# Patient Record
Sex: Male | Born: 1940 | Race: White | Hispanic: No | Marital: Married | Smoking: Never smoker
Health system: Southern US, Community
[De-identification: ages and names within clinical notes are randomized; demographics above are authoritative.]

## PROBLEM LIST (undated history)

## (undated) DIAGNOSIS — N189 Chronic kidney disease, unspecified: Secondary | ICD-10-CM

## (undated) DIAGNOSIS — I10 Essential (primary) hypertension: Secondary | ICD-10-CM

## (undated) HISTORY — PX: TONSILLECTOMY: SUR1361

---

## 2001-08-16 HISTORY — PX: EYE SURGERY: SHX253

## 2014-01-04 ENCOUNTER — Other Ambulatory Visit: Payer: Self-pay | Admitting: Family Medicine

## 2014-01-04 DIAGNOSIS — M545 Low back pain, unspecified: Secondary | ICD-10-CM

## 2014-01-09 ENCOUNTER — Ambulatory Visit
Admission: RE | Admit: 2014-01-09 | Discharge: 2014-01-09 | Disposition: A | Discharge status: Home / Self Care | Payer: Medicare PPO | Source: Ambulatory Visit | Attending: Family Medicine | Admitting: Family Medicine

## 2014-01-09 DIAGNOSIS — M545 Low back pain, unspecified: Secondary | ICD-10-CM

## 2015-07-08 DIAGNOSIS — E782 Mixed hyperlipidemia: Secondary | ICD-10-CM | POA: Diagnosis not present

## 2015-07-08 DIAGNOSIS — J309 Allergic rhinitis, unspecified: Secondary | ICD-10-CM | POA: Diagnosis not present

## 2015-07-08 DIAGNOSIS — I10 Essential (primary) hypertension: Secondary | ICD-10-CM | POA: Diagnosis not present

## 2015-07-08 DIAGNOSIS — N529 Male erectile dysfunction, unspecified: Secondary | ICD-10-CM | POA: Diagnosis not present

## 2015-07-08 DIAGNOSIS — Z23 Encounter for immunization: Secondary | ICD-10-CM | POA: Diagnosis not present

## 2015-07-08 DIAGNOSIS — N183 Chronic kidney disease, stage 3 (moderate): Secondary | ICD-10-CM | POA: Diagnosis not present

## 2016-01-16 ENCOUNTER — Encounter (INDEPENDENT_AMBULATORY_CARE_PROVIDER_SITE_OTHER): Payer: Medicare Other | Admitting: Ophthalmology

## 2016-01-16 DIAGNOSIS — H35033 Hypertensive retinopathy, bilateral: Secondary | ICD-10-CM | POA: Diagnosis not present

## 2016-01-16 DIAGNOSIS — H26492 Other secondary cataract, left eye: Secondary | ICD-10-CM

## 2016-01-16 DIAGNOSIS — I10 Essential (primary) hypertension: Secondary | ICD-10-CM

## 2016-01-16 DIAGNOSIS — H43813 Vitreous degeneration, bilateral: Secondary | ICD-10-CM

## 2016-01-26 ENCOUNTER — Ambulatory Visit (INDEPENDENT_AMBULATORY_CARE_PROVIDER_SITE_OTHER): Payer: Medicare Other | Admitting: Ophthalmology

## 2016-01-26 DIAGNOSIS — H2702 Aphakia, left eye: Secondary | ICD-10-CM

## 2018-11-01 ENCOUNTER — Ambulatory Visit
Admission: RE | Admit: 2018-11-01 | Discharge: 2018-11-01 | Disposition: A | Discharge status: Home / Self Care | Payer: Medicare Other | Source: Ambulatory Visit | Attending: Family Medicine | Admitting: Family Medicine

## 2018-11-01 ENCOUNTER — Other Ambulatory Visit: Payer: Self-pay

## 2018-11-01 ENCOUNTER — Other Ambulatory Visit: Payer: Self-pay | Admitting: Family Medicine

## 2018-11-01 DIAGNOSIS — R05 Cough: Secondary | ICD-10-CM

## 2018-11-01 DIAGNOSIS — R053 Chronic cough: Secondary | ICD-10-CM

## 2020-05-27 DIAGNOSIS — L570 Actinic keratosis: Secondary | ICD-10-CM | POA: Diagnosis not present

## 2020-05-27 DIAGNOSIS — J309 Allergic rhinitis, unspecified: Secondary | ICD-10-CM | POA: Diagnosis not present

## 2020-05-27 DIAGNOSIS — Z0000 Encounter for general adult medical examination without abnormal findings: Secondary | ICD-10-CM | POA: Diagnosis not present

## 2020-05-27 DIAGNOSIS — N529 Male erectile dysfunction, unspecified: Secondary | ICD-10-CM | POA: Diagnosis not present

## 2020-05-27 DIAGNOSIS — N1831 Chronic kidney disease, stage 3a: Secondary | ICD-10-CM | POA: Diagnosis not present

## 2020-05-27 DIAGNOSIS — M7041 Prepatellar bursitis, right knee: Secondary | ICD-10-CM | POA: Diagnosis not present

## 2020-05-27 DIAGNOSIS — Z1389 Encounter for screening for other disorder: Secondary | ICD-10-CM | POA: Diagnosis not present

## 2020-05-27 DIAGNOSIS — E782 Mixed hyperlipidemia: Secondary | ICD-10-CM | POA: Diagnosis not present

## 2020-05-27 DIAGNOSIS — I10 Essential (primary) hypertension: Secondary | ICD-10-CM | POA: Diagnosis not present

## 2020-06-25 IMAGING — DX CHEST - 2 VIEW
2 series · 2 of 2 positions shown · non-contrast
Comparison: None.

CLINICAL DATA: Cough

EXAM:
CHEST - 2 VIEW

[dg chest 2 view (1 of 2)]
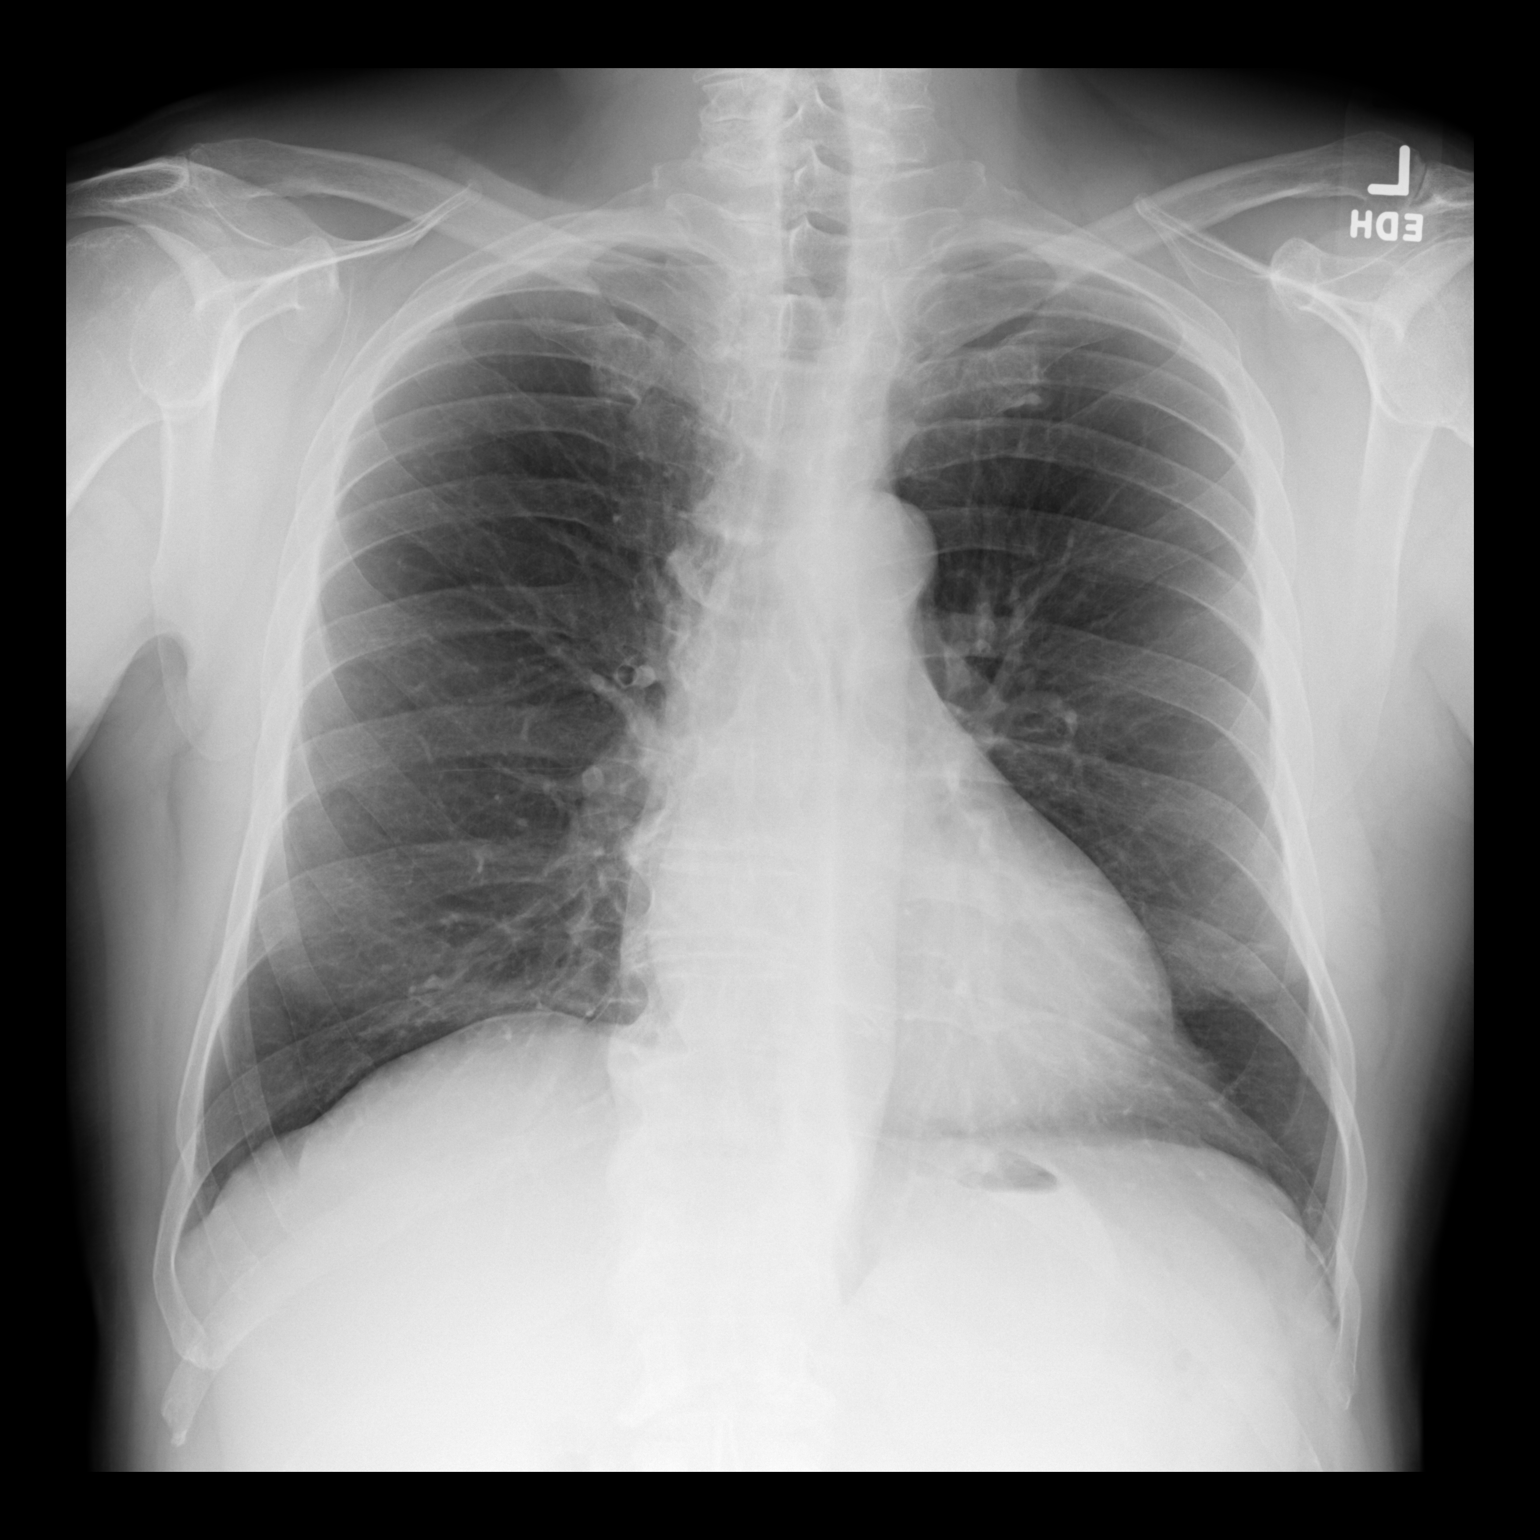

[dg chest 2 view (2 of 2)]
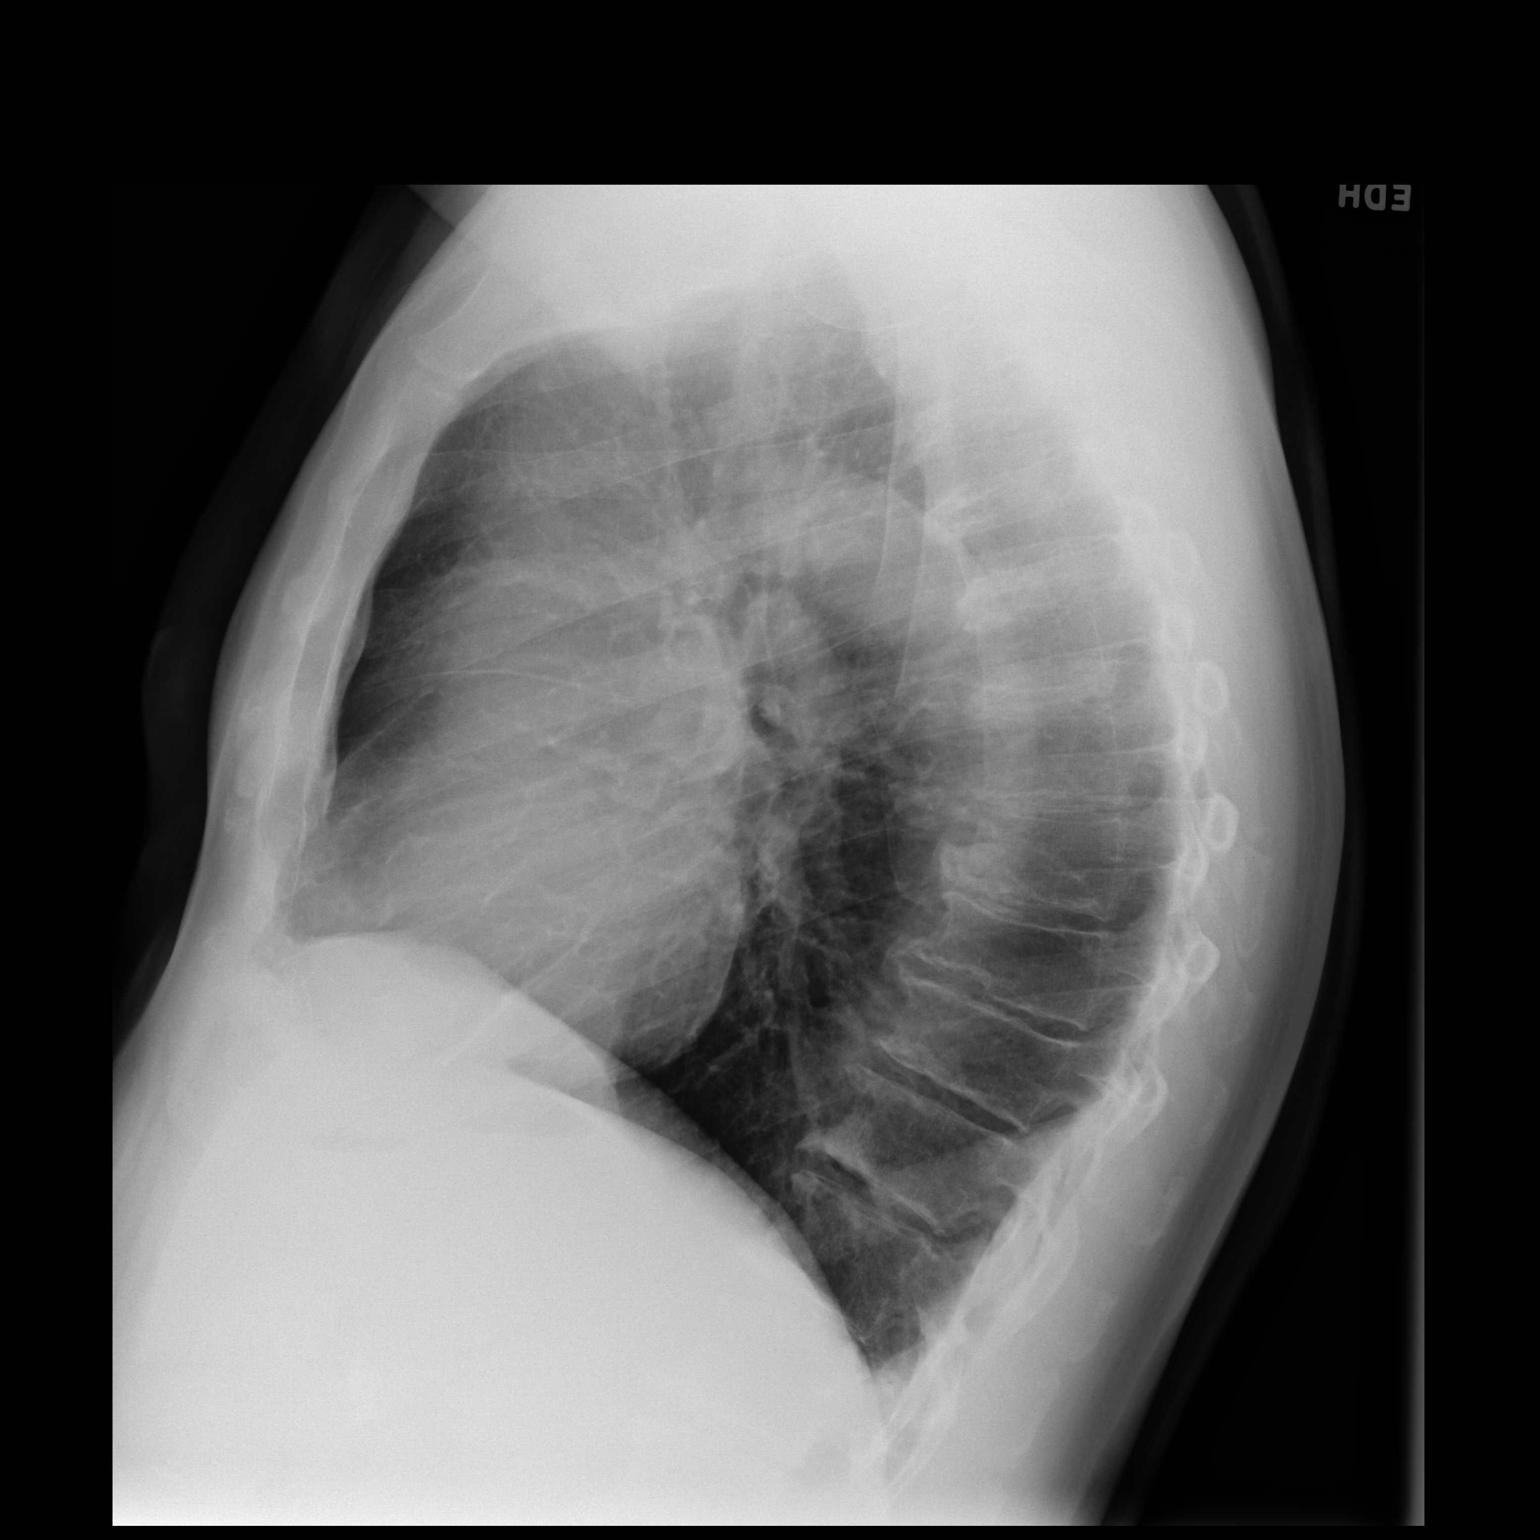

[2 of 2 positions shown; findings below may reference images not displayed]

FINDINGS: Heart and mediastinal contours are within normal limits. No focal
opacities or effusions. No acute bony abnormality. Degenerative
changes in the thoracic spine.
IMPRESSION: No active cardiopulmonary disease.

## 2020-09-09 DIAGNOSIS — D485 Neoplasm of uncertain behavior of skin: Secondary | ICD-10-CM | POA: Diagnosis not present

## 2020-09-09 DIAGNOSIS — L820 Inflamed seborrheic keratosis: Secondary | ICD-10-CM | POA: Diagnosis not present

## 2020-09-09 DIAGNOSIS — L603 Nail dystrophy: Secondary | ICD-10-CM | POA: Diagnosis not present

## 2020-09-09 DIAGNOSIS — L821 Other seborrheic keratosis: Secondary | ICD-10-CM | POA: Diagnosis not present

## 2020-09-09 DIAGNOSIS — L570 Actinic keratosis: Secondary | ICD-10-CM | POA: Diagnosis not present

## 2020-09-09 DIAGNOSIS — C4441 Basal cell carcinoma of skin of scalp and neck: Secondary | ICD-10-CM | POA: Diagnosis not present

## 2020-09-09 DIAGNOSIS — D044 Carcinoma in situ of skin of scalp and neck: Secondary | ICD-10-CM | POA: Diagnosis not present

## 2020-12-01 DIAGNOSIS — N1831 Chronic kidney disease, stage 3a: Secondary | ICD-10-CM | POA: Diagnosis not present

## 2020-12-01 DIAGNOSIS — D692 Other nonthrombocytopenic purpura: Secondary | ICD-10-CM | POA: Diagnosis not present

## 2020-12-01 DIAGNOSIS — J309 Allergic rhinitis, unspecified: Secondary | ICD-10-CM | POA: Diagnosis not present

## 2020-12-01 DIAGNOSIS — I10 Essential (primary) hypertension: Secondary | ICD-10-CM | POA: Diagnosis not present

## 2020-12-01 DIAGNOSIS — M48061 Spinal stenosis, lumbar region without neurogenic claudication: Secondary | ICD-10-CM | POA: Diagnosis not present

## 2020-12-01 DIAGNOSIS — N529 Male erectile dysfunction, unspecified: Secondary | ICD-10-CM | POA: Diagnosis not present

## 2020-12-01 DIAGNOSIS — E782 Mixed hyperlipidemia: Secondary | ICD-10-CM | POA: Diagnosis not present

## 2021-03-10 DIAGNOSIS — D485 Neoplasm of uncertain behavior of skin: Secondary | ICD-10-CM | POA: Diagnosis not present

## 2021-03-10 DIAGNOSIS — L570 Actinic keratosis: Secondary | ICD-10-CM | POA: Diagnosis not present

## 2021-03-10 DIAGNOSIS — L821 Other seborrheic keratosis: Secondary | ICD-10-CM | POA: Diagnosis not present

## 2021-03-10 DIAGNOSIS — C44612 Basal cell carcinoma of skin of right upper limb, including shoulder: Secondary | ICD-10-CM | POA: Diagnosis not present

## 2021-03-10 DIAGNOSIS — Z85828 Personal history of other malignant neoplasm of skin: Secondary | ICD-10-CM | POA: Diagnosis not present

## 2021-06-02 DIAGNOSIS — N1831 Chronic kidney disease, stage 3a: Secondary | ICD-10-CM | POA: Diagnosis not present

## 2021-06-02 DIAGNOSIS — N529 Male erectile dysfunction, unspecified: Secondary | ICD-10-CM | POA: Diagnosis not present

## 2021-06-02 DIAGNOSIS — Z0000 Encounter for general adult medical examination without abnormal findings: Secondary | ICD-10-CM | POA: Diagnosis not present

## 2021-06-02 DIAGNOSIS — K4090 Unilateral inguinal hernia, without obstruction or gangrene, not specified as recurrent: Secondary | ICD-10-CM | POA: Diagnosis not present

## 2021-06-02 DIAGNOSIS — I10 Essential (primary) hypertension: Secondary | ICD-10-CM | POA: Diagnosis not present

## 2021-06-02 DIAGNOSIS — M48061 Spinal stenosis, lumbar region without neurogenic claudication: Secondary | ICD-10-CM | POA: Diagnosis not present

## 2021-06-02 DIAGNOSIS — Z1389 Encounter for screening for other disorder: Secondary | ICD-10-CM | POA: Diagnosis not present

## 2021-06-02 DIAGNOSIS — E782 Mixed hyperlipidemia: Secondary | ICD-10-CM | POA: Diagnosis not present

## 2021-06-02 DIAGNOSIS — J309 Allergic rhinitis, unspecified: Secondary | ICD-10-CM | POA: Diagnosis not present

## 2021-06-17 ENCOUNTER — Ambulatory Visit: Payer: Self-pay | Admitting: Surgery

## 2021-06-17 DIAGNOSIS — H918X9 Other specified hearing loss, unspecified ear: Secondary | ICD-10-CM | POA: Diagnosis not present

## 2021-06-17 DIAGNOSIS — M6208 Separation of muscle (nontraumatic), other site: Secondary | ICD-10-CM | POA: Diagnosis not present

## 2021-06-17 DIAGNOSIS — K4090 Unilateral inguinal hernia, without obstruction or gangrene, not specified as recurrent: Secondary | ICD-10-CM | POA: Diagnosis not present

## 2021-08-14 NOTE — Patient Instructions (Addendum)
DUE TO COVID-19 ONLY ONE VISITOR IS ALLOWED TO COME WITH YOU AND STAY IN THE WAITING ROOM ONLY DURING PRE OP AND PROCEDURE DAY OF SURGERY IF YOU ARE GOING HOME AFTER SURGERY. IF YOU ARE SPENDING THE NIGHT 2 PEOPLE MAY VISIT WITH YOU IN YOUR PRIVATE ROOM AFTER SURGERY UNTIL VISITING  HOURS ARE OVER AT 800 PM AND 1  VISITOR  MAY  SPEND THE NIGHT.                 Terry Parker.    Your procedure is scheduled on: 08/27/21   Report to [facility] Main  Entrance   Report to admitting at  8:15 AM     Call this number if you have problems the morning of surgery [phone]   No food after midnight.    You may have clear liquid until 7:30 AM.    At 7:00 AM drink pre surgery drink.   Nothing by mouth after 7:30 AM.    CLEAR LIQUID DIET   Foods Allowed                                                                     Foods Excluded  Coffee and tea, regular and decaf                             liquids that you cannot  Plain Jell-O any favor except red or purple                                           see through such as: Fruit ices (not with fruit pulp)                                     milk, soups, orange juice  Iced Popsicles                                    All solid food Carbonated beverages, regular and diet                                    Cranberry, grape and apple juices Sports drinks like Gatorade Lightly seasoned clear broth or consume(fat free) Sugar     BRUSH YOUR TEETH MORNING OF SURGERY AND RINSE YOUR MOUTH OUT, NO CHEWING GUM CANDY OR MINTS.     Take these medicines the morning of surgery with A SIP OF WATER: none  Stop taking aspirin___________on __________as instructed by _____________.    Contact your Surgeon/Cardiologist for instructions on Anticoagulant Therapy prior to surgery.                                  You may not have any metal on your body including  piercings  Do not wear jewelry, lotions, powders or   deodorant             Men may shave face and neck.   Do not bring valuables to the hospital. [place].  Contacts, dentures or bridgework may not be worn into surgery.     Patients discharged the day of surgery will not be allowed to drive home.  IF YOU ARE HAVING SURGERY AND GOING HOME THE SAME DAY, YOU MUST HAVE AN ADULT TO DRIVE YOU HOME AND BE WITH YOU FOR 24 HOURS. YOU MAY GO HOME BY TAXI OR UBER OR ORTHERWISE, BUT AN ADULT MUST ACCOMPANY YOU HOME AND STAY WITH YOU FOR 24 HOURS.  Name and phone number of your driver:  Special Instructions: N/A              Please read over the following fact sheets you were given: _____________________________________________________________________             [name] - Preparing for Surgery Before surgery, you can play an important role.  Because skin is not sterile, your skin needs to be as free of germs as possible.  You can reduce the number of germs on your skin by washing with CHG (chlorahexidine gluconate) soap before surgery.  CHG is an antiseptic cleaner which kills germs and bonds with the skin to continue killing germs even after washing. Please DO NOT use if you have an allergy to CHG or antibacterial soaps.  If your skin becomes reddened/irritated stop using the CHG and inform your nurse when you arrive at Short Stay.  You may shave your face/neck.  Please follow these instructions carefully:  1.  Shower with CHG Soap the night before surgery and the  morning of Surgery.  2.  If you choose to wash your hair, wash your hair first as usual with your  normal  shampoo.  3.  After you shampoo, rinse your hair and body thoroughly to remove the  shampoo.                            4.  Use CHG as you would any other liquid soap.  You can apply chg directly  to the skin and wash                       Gently with a scrungie or clean washcloth.  5.  Apply the CHG Soap to your body ONLY FROM THE  NECK DOWN.   Do not use on face/ open                           Wound or open sores. Avoid contact with eyes, ears mouth and genitals (private parts).                       Wash face,  Genitals (private parts) with your normal soap.             6.  Wash thoroughly, paying special attention to the area where your surgery  will be performed.  7.  Thoroughly rinse your body with warm water from the neck down.  8.  DO NOT shower/wash with your normal soap after using and rinsing off  the CHG Soap.  9.  Pat yourself dry with a clean towel.            10.  Wear clean pajamas.            11.  Place clean sheets on your bed the night of your first shower and do not  sleep with pets. Day of Surgery : Do not apply any lotions/deodorants the morning of surgery.  Please wear clean clothes to the hospital/surgery center.  FAILURE TO FOLLOW THESE INSTRUCTIONS MAY RESULT IN THE CANCELLATION OF YOUR SURGERY PATIENT SIGNATURE_________________________________  NURSE SIGNATURE__________________________________  ________________________________________________________________________

## 2021-08-18 ENCOUNTER — Other Ambulatory Visit: Payer: Self-pay

## 2021-08-18 ENCOUNTER — Encounter (HOSPITAL_COMMUNITY)
Admission: RE | Admit: 2021-08-18 | Discharge: 2021-08-18 | Disposition: A | Discharge status: Home / Self Care | Payer: Medicare PPO | Source: Ambulatory Visit | Attending: Surgery | Admitting: Surgery

## 2021-08-18 ENCOUNTER — Encounter (HOSPITAL_COMMUNITY): Payer: Self-pay

## 2021-08-18 VITALS — BP 157/68 | HR 60 | Temp 98.4°F | Resp 18 | Ht 68.5 in | Wt 167.0 lb

## 2021-08-18 DIAGNOSIS — Z01818 Encounter for other preprocedural examination: Secondary | ICD-10-CM | POA: Diagnosis not present

## 2021-08-18 DIAGNOSIS — N289 Disorder of kidney and ureter, unspecified: Secondary | ICD-10-CM | POA: Diagnosis not present

## 2021-08-18 HISTORY — DX: Chronic kidney disease, unspecified: N18.9

## 2021-08-18 HISTORY — DX: Essential (primary) hypertension: I10

## 2021-08-18 LAB — BASIC METABOLIC PANEL
Anion gap: 3 — ABNORMAL LOW (ref 5–15)
BUN: 16 mg/dL (ref 8–23)
CO2: 29 mmol/L (ref 22–32)
Calcium: 9.2 mg/dL (ref 8.9–10.3)
Chloride: 103 mmol/L (ref 98–111)
Creatinine, Ser: 1.32 mg/dL — ABNORMAL HIGH (ref 0.61–1.24)
GFR, Estimated: 55 mL/min — ABNORMAL LOW (ref >60)
Glucose, Bld: 95 mg/dL (ref 70–99)
Potassium: 4.2 mmol/L (ref 3.5–5.1)
Sodium: 135 mmol/L (ref 135–145)

## 2021-08-18 LAB — CBC
HCT: 36.9 % — ABNORMAL LOW (ref 39.0–52.0)
Hemoglobin: 12.7 g/dL — ABNORMAL LOW (ref 13.0–17.0)
MCH: 31.6 pg (ref 26.0–34.0)
MCHC: 34.4 g/dL (ref 30.0–36.0)
MCV: 91.8 fL (ref 80.0–100.0)
Platelets: 220 10*3/uL (ref 150–400)
RBC: 4.02 MIL/uL — ABNORMAL LOW (ref 4.22–5.81)
RDW: 12.2 % (ref 11.5–15.5)
WBC: 5.3 10*3/uL (ref 4.0–10.5)
nRBC: 0 % (ref 0.0–0.2)

## 2021-08-18 NOTE — Progress Notes (Signed)
COVID test- NA  PCP - Dr. Iline Oven Cardiologist - none  Chest x-ray - no EKG - 08/18/21-chart Stress Test - no ECHO - no Cardiac Cath - NA Pacemaker/ICD device last checked:NA  Sleep Study - NA CPAP -   Fasting Blood Sugar - NA Checks Blood Sugar _____ times a day  Blood Thinner Instructions:ASA 162 mg/ Dr. Iline Oven Aspirin Instructions:none Pt will call dr Johney Maine Last Dose:  Anesthesia review: no  Patient denies shortness of breath, fever, cough and chest pain at PAT appointment Pt is HOH. He has no SOB with activities  Patient verbalized understanding of instructions that were given to them at the PAT appointment. Patient was also instructed that they will need to review over the PAT instructions again at home before surgery. yes

## 2021-08-27 ENCOUNTER — Encounter (HOSPITAL_COMMUNITY)
Admission: RE | Disposition: A | Discharge status: Home / Self Care | Payer: Self-pay | Source: Home / Self Care | Attending: Surgery

## 2021-08-27 ENCOUNTER — Ambulatory Visit (HOSPITAL_COMMUNITY): Payer: Medicare PPO | Admitting: Emergency Medicine

## 2021-08-27 ENCOUNTER — Encounter (HOSPITAL_COMMUNITY): Payer: Self-pay | Admitting: Surgery

## 2021-08-27 ENCOUNTER — Ambulatory Visit (HOSPITAL_COMMUNITY)
Admission: RE | Admit: 2021-08-27 | Discharge: 2021-08-27 | Disposition: A | Discharge status: Home / Self Care | Payer: Medicare PPO | Attending: Surgery | Admitting: Surgery

## 2021-08-27 ENCOUNTER — Ambulatory Visit (HOSPITAL_COMMUNITY): Payer: Medicare PPO | Admitting: Anesthesiology

## 2021-08-27 DIAGNOSIS — Z7982 Long term (current) use of aspirin: Secondary | ICD-10-CM | POA: Insufficient documentation

## 2021-08-27 DIAGNOSIS — K660 Peritoneal adhesions (postprocedural) (postinfection): Secondary | ICD-10-CM | POA: Diagnosis not present

## 2021-08-27 DIAGNOSIS — I129 Hypertensive chronic kidney disease with stage 1 through stage 4 chronic kidney disease, or unspecified chronic kidney disease: Secondary | ICD-10-CM | POA: Diagnosis not present

## 2021-08-27 DIAGNOSIS — D176 Benign lipomatous neoplasm of spermatic cord: Secondary | ICD-10-CM | POA: Diagnosis not present

## 2021-08-27 DIAGNOSIS — M6208 Separation of muscle (nontraumatic), other site: Secondary | ICD-10-CM | POA: Diagnosis not present

## 2021-08-27 DIAGNOSIS — N189 Chronic kidney disease, unspecified: Secondary | ICD-10-CM | POA: Diagnosis not present

## 2021-08-27 DIAGNOSIS — K4020 Bilateral inguinal hernia, without obstruction or gangrene, not specified as recurrent: Secondary | ICD-10-CM | POA: Diagnosis not present

## 2021-08-27 DIAGNOSIS — H9190 Unspecified hearing loss, unspecified ear: Secondary | ICD-10-CM | POA: Diagnosis not present

## 2021-08-27 HISTORY — PX: INGUINAL HERNIA REPAIR: SHX194

## 2021-08-27 SURGERY — REPAIR, HERNIA, INGUINAL, LAPAROSCOPIC
Anesthesia: General | Site: Abdomen | Laterality: Bilateral

## 2021-08-27 MED ORDER — CEFAZOLIN SODIUM-DEXTROSE 2-4 GM/100ML-% IV SOLN
2.0000 g | INTRAVENOUS | Status: AC
  Administered 2021-08-27: 2 g via INTRAVENOUS
  Filled 2021-08-27: qty 100

## 2021-08-27 MED ORDER — PROMETHAZINE HCL 25 MG/ML IJ SOLN: 6.2500 mg | INTRAMUSCULAR | Status: DC | PRN

## 2021-08-27 MED ORDER — ACETAMINOPHEN 10 MG/ML IV SOLN: 1000.0000 mg | Freq: Once | INTRAVENOUS | Status: DC | PRN

## 2021-08-27 MED ORDER — ROCURONIUM BROMIDE 10 MG/ML (PF) SYRINGE
PREFILLED_SYRINGE | INTRAVENOUS | Status: DC | PRN
  Administered 2021-08-27: 10 mg via INTRAVENOUS
  Administered 2021-08-27: 60 mg via INTRAVENOUS

## 2021-08-27 MED ORDER — LIDOCAINE 2% (20 MG/ML) 5 ML SYRINGE
INTRAMUSCULAR | Status: AC
  Filled 2021-08-27: qty 5

## 2021-08-27 MED ORDER — DEXAMETHASONE SODIUM PHOSPHATE 4 MG/ML IJ SOLN
INTRAMUSCULAR | Status: DC | PRN
Start: 2021-08-27 — End: 2021-08-27
  Administered 2021-08-27: 6 mg via INTRAVENOUS

## 2021-08-27 MED ORDER — FENTANYL CITRATE (PF) 100 MCG/2ML IJ SOLN
INTRAMUSCULAR | Status: AC
  Filled 2021-08-27: qty 2

## 2021-08-27 MED ORDER — PROPOFOL 10 MG/ML IV BOLUS
INTRAVENOUS | Status: DC | PRN
  Administered 2021-08-27: 60 mg via INTRAVENOUS
  Administered 2021-08-27: 140 mg via INTRAVENOUS

## 2021-08-27 MED ORDER — SODIUM CHLORIDE 0.9% FLUSH: 3.0000 mL | INTRAVENOUS | Status: DC | PRN

## 2021-08-27 MED ORDER — GABAPENTIN 300 MG PO CAPS
300.0000 mg | ORAL_CAPSULE | ORAL | Status: AC
  Administered 2021-08-27: 300 mg via ORAL
  Filled 2021-08-27: qty 1

## 2021-08-27 MED ORDER — FENTANYL CITRATE (PF) 100 MCG/2ML IJ SOLN
INTRAMUSCULAR | Status: DC | PRN
  Administered 2021-08-27 (×3): 50 ug via INTRAVENOUS

## 2021-08-27 MED ORDER — CHLORHEXIDINE GLUCONATE CLOTH 2 % EX PADS: 6.0000 | MEDICATED_PAD | Freq: Once | CUTANEOUS | Status: DC

## 2021-08-27 MED ORDER — CHLORHEXIDINE GLUCONATE 0.12 % MT SOLN: 15.0000 mL | Freq: Once | OROMUCOSAL | Status: AC

## 2021-08-27 MED ORDER — TRAMADOL HCL 50 MG PO TABS: 50.0000 mg | ORAL_TABLET | Freq: Four times a day (QID) | ORAL | 0 refills | Status: AC | PRN

## 2021-08-27 MED ORDER — OXYCODONE HCL 5 MG PO TABS: 5.0000 mg | ORAL_TABLET | Freq: Once | ORAL | Status: DC | PRN

## 2021-08-27 MED ORDER — ACETAMINOPHEN 325 MG PO TABS: 325.0000 mg | ORAL_TABLET | ORAL | Status: DC | PRN

## 2021-08-27 MED ORDER — EPHEDRINE 5 MG/ML INJ
INTRAVENOUS | Status: AC
  Filled 2021-08-27: qty 5

## 2021-08-27 MED ORDER — LIDOCAINE 2% (20 MG/ML) 5 ML SYRINGE
INTRAMUSCULAR | Status: DC | PRN
  Administered 2021-08-27: 60 mg via INTRAVENOUS

## 2021-08-27 MED ORDER — DEXAMETHASONE SODIUM PHOSPHATE 10 MG/ML IJ SOLN
INTRAMUSCULAR | Status: AC
  Filled 2021-08-27: qty 1

## 2021-08-27 MED ORDER — ORAL CARE MOUTH RINSE
15.0000 mL | Freq: Once | OROMUCOSAL | Status: AC
  Administered 2021-08-27: 15 mL via OROMUCOSAL

## 2021-08-27 MED ORDER — ROCURONIUM BROMIDE 10 MG/ML (PF) SYRINGE
PREFILLED_SYRINGE | INTRAVENOUS | Status: AC
  Filled 2021-08-27: qty 10

## 2021-08-27 MED ORDER — BUPIVACAINE-EPINEPHRINE 0.25% -1:200000 IJ SOLN
INTRAMUSCULAR | Status: DC | PRN
  Administered 2021-08-27: 60 mL

## 2021-08-27 MED ORDER — EPHEDRINE SULFATE-NACL 50-0.9 MG/10ML-% IV SOSY
PREFILLED_SYRINGE | INTRAVENOUS | Status: DC | PRN
Start: 2021-08-27 — End: 2021-08-27
  Administered 2021-08-27: 5 mg via INTRAVENOUS
  Administered 2021-08-27: 10 mg via INTRAVENOUS
  Administered 2021-08-27: 5 mg via INTRAVENOUS

## 2021-08-27 MED ORDER — CHLORHEXIDINE GLUCONATE CLOTH 2 % EX PADS
6.0000 | MEDICATED_PAD | Freq: Once | CUTANEOUS | Status: DC
Start: 2021-08-27 — End: 2021-08-27

## 2021-08-27 MED ORDER — BUPIVACAINE LIPOSOME 1.3 % IJ SUSP
INTRAMUSCULAR | Status: AC
  Filled 2021-08-27: qty 20

## 2021-08-27 MED ORDER — ACETAMINOPHEN 500 MG PO TABS
1000.0000 mg | ORAL_TABLET | ORAL | Status: AC
  Administered 2021-08-27: 1000 mg via ORAL
  Filled 2021-08-27: qty 2

## 2021-08-27 MED ORDER — 0.9 % SODIUM CHLORIDE (POUR BTL) OPTIME
TOPICAL | Status: DC | PRN
  Administered 2021-08-27: 1000 mL

## 2021-08-27 MED ORDER — LACTATED RINGERS IV SOLN: INTRAVENOUS | Status: DC

## 2021-08-27 MED ORDER — OXYCODONE HCL 5 MG/5ML PO SOLN: 5.0000 mg | Freq: Once | ORAL | Status: DC | PRN

## 2021-08-27 MED ORDER — ONDANSETRON HCL 4 MG/2ML IJ SOLN
INTRAMUSCULAR | Status: DC | PRN
Start: 2021-08-27 — End: 2021-08-27
  Administered 2021-08-27: 4 mg via INTRAVENOUS

## 2021-08-27 MED ORDER — ACETAMINOPHEN 160 MG/5ML PO SOLN: 325.0000 mg | ORAL | Status: DC | PRN

## 2021-08-27 MED ORDER — ENSURE PRE-SURGERY PO LIQD: 296.0000 mL | Freq: Once | ORAL | Status: DC

## 2021-08-27 MED ORDER — AMISULPRIDE (ANTIEMETIC) 5 MG/2ML IV SOLN: 10.0000 mg | Freq: Once | INTRAVENOUS | Status: DC | PRN

## 2021-08-27 MED ORDER — SODIUM CHLORIDE 0.9% FLUSH: 3.0000 mL | Freq: Two times a day (BID) | INTRAVENOUS | Status: DC

## 2021-08-27 MED ORDER — ONDANSETRON HCL 4 MG/2ML IJ SOLN
INTRAMUSCULAR | Status: AC
  Filled 2021-08-27: qty 2

## 2021-08-27 MED ORDER — PHENYLEPHRINE 40 MCG/ML (10ML) SYRINGE FOR IV PUSH (FOR BLOOD PRESSURE SUPPORT)
PREFILLED_SYRINGE | INTRAVENOUS | Status: AC
  Filled 2021-08-27: qty 10

## 2021-08-27 MED ORDER — BUPIVACAINE LIPOSOME 1.3 % IJ SUSP
INTRAMUSCULAR | Status: DC | PRN
  Administered 2021-08-27: 20 mL

## 2021-08-27 MED ORDER — BUPIVACAINE LIPOSOME 1.3 % IJ SUSP: 20.0000 mL | Freq: Once | INTRAMUSCULAR | Status: DC

## 2021-08-27 MED ORDER — SODIUM CHLORIDE 0.9 % IV SOLN: 250.0000 mL | INTRAVENOUS | Status: DC | PRN

## 2021-08-27 MED ORDER — BUPIVACAINE-EPINEPHRINE (PF) 0.25% -1:200000 IJ SOLN
INTRAMUSCULAR | Status: AC
  Filled 2021-08-27: qty 60

## 2021-08-27 MED ORDER — FENTANYL CITRATE PF 50 MCG/ML IJ SOSY: 25.0000 ug | PREFILLED_SYRINGE | INTRAMUSCULAR | Status: DC | PRN

## 2021-08-27 MED ORDER — SUGAMMADEX SODIUM 200 MG/2ML IV SOLN
INTRAVENOUS | Status: DC | PRN
  Administered 2021-08-27: 200 mg via INTRAVENOUS

## 2021-08-27 SURGICAL SUPPLY — 40 items
BAG COUNTER SPONGE SURGICOUNT (BAG) IMPLANT
CABLE HIGH FREQUENCY MONO STRZ (ELECTRODE) ×2 IMPLANT
CHLORAPREP W/TINT 26 (MISCELLANEOUS) ×2 IMPLANT
COVER SURGICAL LIGHT HANDLE (MISCELLANEOUS) ×2 IMPLANT
DECANTER SPIKE VIAL GLASS SM (MISCELLANEOUS) ×2 IMPLANT
DEVICE SECURE STRAP 25 ABSORB (INSTRUMENTS) IMPLANT
DRAPE WARM FLUID 44X44 (DRAPES) ×2 IMPLANT
DRSG TEGADERM 2-3/8X2-3/4 SM (GAUZE/BANDAGES/DRESSINGS) ×3 IMPLANT
DRSG TEGADERM 4X4.75 (GAUZE/BANDAGES/DRESSINGS) ×2 IMPLANT
ELECT REM PT RETURN 15FT ADLT (MISCELLANEOUS) ×2 IMPLANT
GAUZE SPONGE 2X2 8PLY STRL LF (GAUZE/BANDAGES/DRESSINGS) ×1 IMPLANT
GLOVE SURG NEOPR MICRO LF SZ8 (GLOVE) ×2 IMPLANT
GLOVE SURG UNDER LTX SZ8 (GLOVE) ×2 IMPLANT
GOWN STRL REUS W/TWL XL LVL3 (GOWN DISPOSABLE) ×4 IMPLANT
IRRIG SUCT STRYKERFLOW 2 WTIP (MISCELLANEOUS)
IRRIGATION SUCT STRKRFLW 2 WTP (MISCELLANEOUS) IMPLANT
KIT BASIN OR (CUSTOM PROCEDURE TRAY) ×2 IMPLANT
KIT TURNOVER KIT A (KITS) IMPLANT
MARKER SKIN DUAL TIP RULER LAB (MISCELLANEOUS) ×2 IMPLANT
MESH BARD SOFT 6X6IN (Mesh General) ×2 IMPLANT
MESH HERNIA 6X6 BARD (Mesh General) IMPLANT
MESH HERNIA BARD 6X6 (Mesh General) ×1 IMPLANT
NDL INSUFFLATION 14GA 120MM (NEEDLE) IMPLANT
NEEDLE INSUFFLATION 14GA 120MM (NEEDLE) IMPLANT
PAD POSITIONING PINK XL (MISCELLANEOUS) ×2 IMPLANT
SCISSORS LAP 5X35 DISP (ENDOMECHANICALS) ×2 IMPLANT
SET TUBE SMOKE EVAC HIGH FLOW (TUBING) ×2 IMPLANT
SLEEVE ADV FIXATION 5X100MM (TROCAR) ×2 IMPLANT
SPONGE GAUZE 2X2 STER 10/PKG (GAUZE/BANDAGES/DRESSINGS) ×1
SUT MNCRL AB 4-0 PS2 18 (SUTURE) ×2 IMPLANT
SUT PDS AB 1 CT1 27 (SUTURE) ×4 IMPLANT
SUT VIC AB 2-0 SH 27 (SUTURE)
SUT VIC AB 2-0 SH 27X BRD (SUTURE) IMPLANT
SUT VICRYL 0 UR6 27IN ABS (SUTURE) ×2 IMPLANT
TACKER 5MM HERNIA 3.5CML NAB (ENDOMECHANICALS) IMPLANT
TOWEL OR 17X26 10 PK STRL BLUE (TOWEL DISPOSABLE) ×2 IMPLANT
TOWEL OR NON WOVEN STRL DISP B (DISPOSABLE) ×2 IMPLANT
TRAY LAPAROSCOPIC (CUSTOM PROCEDURE TRAY) ×2 IMPLANT
TROCAR ADV FIXATION 5X100MM (TROCAR) ×2 IMPLANT
TROCAR XCEL BLUNT TIP 100MML (ENDOMECHANICALS) ×2 IMPLANT

## 2021-08-27 NOTE — Op Note (Signed)
08/27/2021  11:00 AM  PATIENT:  Terry Parker.  81 y.o. male  Patient Care Team: Antony Contras, MD as PCP - General (Family Medicine) Michael Boston, MD as Consulting Physician (General Surgery)  PRE-OPERATIVE DIAGNOSIS:  LEFT AND POSSIBLE RIGHT INGUINAL HERNIAS  POST-OPERATIVE DIAGNOSIS:   BILATERAL INGUINAL HERNIAS  PROCEDURE:   LAPAROSCOPIC BILATERAL INGUINAL HERNIA REPAIRS WITH MESH  TRANSVERSUS ABDOMINIS PLANE (TAP) BLOCK - BILATERAL  SURGEON:  [name], MD  ASSISTANT: None  ANESTHESIA:     Regional ilioinguinal and genitofemoral and spermatic cord nerve blocks  General  Regional TRANSVERSUS ABDOMINIS PLANE (TAP) nerve block for perioperative & postoperative pain control provided with liposomal bupivacaine (Experel) mixed with 0.25% bupivacaine as a Bilateral TAP block x 56mL each side at the level of the transverse abdominis & preperitoneal spaces along the flank at the anterior axillary line, from subcostal ridge to iliac crest under laparoscopic guidance    EBL:  Total I/O In: 1000 [I.V.:900; IV Piggyback:100] Out: 20 [Blood:20].  See anesthesia record  Delay start of Pharmacological VTE agent (>24hrs) due to surgical blood loss or risk of bleeding:  no  DRAINS: NONE  SPECIMEN:  NONE  DISPOSITION OF SPECIMEN:  N/A  COUNTS:  YES  PLAN OF CARE: Discharge to home after PACU  PATIENT DISPOSITION:  PACU - hemodynamically stable.  INDICATION: Pleasant active gentleman with obvious left inguinal hernia with some sensitivity and increasing in size.  Suspected impulse on right side with some occasional symptoms there as well.  I recommended diagnostic laparoscopy and repair of hernias found.  The anatomy & physiology of the abdominal wall and pelvic floor was discussed.  The pathophysiology of hernias in the inguinal and pelvic region was discussed.  Natural history risks such as progressive enlargement, pain, incarceration & strangulation was discussed.    Contributors to complications such as smoking, obesity, diabetes, prior surgery, etc were discussed.    I feel the risks of no intervention will lead to serious problems that outweigh the operative risks; therefore, I recommended surgery to reduce and repair the hernia.  I explained laparoscopic techniques with possible need for an open approach.  I noted usual use of mesh to patch and/or buttress hernia repair  Risks such as bleeding, infection, abscess, need for further treatment, heart attack, death, and other risks were discussed.  I noted a good likelihood this will help address the problem.   Goals of post-operative recovery were discussed as well.  Possibility that this will not correct all symptoms was explained.  I stressed the importance of low-impact activity, aggressive pain control, avoiding constipation, & not pushing through pain to minimize risk of post-operative chronic pain or injury. Possibility of reherniation was discussed.  We will work to minimize complications.     An educational handout further explaining the pathology & treatment options was given as well.  Questions were answered.  The patient expresses understanding & wishes to proceed with surgery.  OR FINDINGS: Moderate sized left indirect inguinal hernia with large spermatic cord lipomas.  No direct space inguinal, femoral, nor obturator  hernias.  On the right side some thickening and adhesions there.  Small indirect inguinal hernia.  No direct space inguinal, femoral, obturator hernias.  DESCRIPTION:  The patient was identified & brought into the operating room. The patient was positioned supine with arms tucked. SCDs were active during the entire case. The patient underwent general anesthesia without any difficulty.  The abdomen was prepped and draped in a sterile  fashion. The patient's bladder was emptied.  A Surgical Timeout confirmed our plan.  I made a transverse incision through the inferior umbilical fold.  I  made a small transverse nick through the anterior rectus fascia contralateral to the inguinal hernia side and placed a 0-vicryl stitch through the fascia.  I placed a Hasson trocar into the preperitoneal plane.  Entry was clean.  We induced carbon dioxide insufflation. Camera inspection revealed no injury.  I used a 25mm angled scope to bluntly free the peritoneum off the infraumbilical anterior abdominal wall.  I created enough of a preperitoneal pocket to place 24mm ports into the right & left mid-abdomen into this preperitoneal cavity.  I focused attention on the LEFT pelvis since that was the dominant hernia side.   I used blunt & focused sharp dissection to free the peritoneum off the flank and down to the pubic rim.  I freed the anteriolateral bladder wall off the anteriolateral pelvic wall, sparing midline attachments.   I located a swath of peritoneum going into a hernia fascial defect at the  internal ring consistent with  an indirect inguinal hernia..  I gradually freed the peritoneal hernia sac off safely and reduced it into the preperitoneal space.  I freed the peritoneum off the spermatic vessels & vas deferens.  I freed peritoneum off the retroperitoneum along the psoas muscle.  Spermatic cord lipoma was dissected away & removed.  I checked & assured hemostasis.     I turned attention on the opposite  RIGHT pelvis.  I did dissection in a similar, mirror-image fashion.  Tissues were somewhat thickened here with more dense preparing adhesions but carefully freed off.  After careful dissection I did confirm that the patient had an indirect inguinal hernia.Marland Kitchen   Spermatic cord lipoma was dissected away & removed.    I checked & assured hemostasis.  I did require a peritoneal repair and high ligation of hernia sac using laparoscopic intracorporeal suturing with 2-0 Vicryl suture.  I chose sheets of medium-weight polypropylene Bard Marlex 15x15cm, one for the left side.  For the right side I chose a  ultralightweight polypropylene Bard Soft 15 x 15 cm mesh.  I cut a single sigmoid-shaped slit ~6cm from a corner of each mesh.  I placed the meshes into the preperitoneal space & laid them as overlapping diamonds such that at the inferior points, a 6x6 cm corner flap rested in the true anterolateral pelvis, covering the obturator & femoral foramina.   I allowed the bladder to return to the pubis, this helping tuck the corners of the mesh in the anteriolateral pelvis.  The medial corners overlapped each other across midline cephalad to the pubic rim.   Given the numerous hernias of moderate size, I placed a third 15x15cm Bard soft mesh in the center as a vertical diamond.  The lateral wings of the mesh overlap across the direct spaces and internal rings where the dominant hernias were.  This provided good coverage and reinforcement of the hernia repairs.  Because of the central mesh placement with good overlap, I did not place any tacks.   I held the hernia sacs cephalad & evacuated carbon dioxide.  I closed the fascia with absorbable suture.  I closed the skin using 4-0 monocryl stitch.  Sterile dressings were applied.   The patient was extubated & arrived in the PACU in stable condition..  I had discussed postoperative care with the patient in the holding area.  Instructions are written in the  chart.  I discussed operative findings, updated the patient's status, discussed probable steps to recovery, and gave postoperative recommendations to the patient's spouse, Tyheim Vanalstyne.  Recommendations were made.  Questions were answered.  She expressed understanding & appreciation.   [name], M.D., F.A.C.S. Gastrointestinal and Minimally Invasive Surgery [facility] [address], [place], [place] [address] [phone] Main / Paging  08/27/2021 11:00 AM

## 2021-08-27 NOTE — Anesthesia Postprocedure Evaluation (Signed)
Anesthesia Post Note  Patient: Terry Parker.  Procedure(s) Performed: LAPAROSCOPIC BILATERAL INGUINAL HERNIA REPAIRS WITH MESH TAP BLOCK (Bilateral: Abdomen)     Patient location during evaluation: PACU Anesthesia Type: General Level of consciousness: awake and alert Pain management: pain level controlled Vital Signs Assessment: post-procedure vital signs reviewed and stable Respiratory status: spontaneous breathing, nonlabored ventilation, respiratory function stable and patient connected to nasal cannula oxygen Cardiovascular status: blood pressure returned to baseline and stable Postop Assessment: no apparent nausea or vomiting Anesthetic complications: no   No notable events documented.  Last Vitals:  Vitals:   08/27/21 1145 08/27/21 1200  BP: (!) 155/67 (!) 149/66  Pulse: 77 78  Resp: 18 19  Temp:  36.7 C  SpO2: 97% 99%    Last Pain:  Vitals:   08/27/21 1200  TempSrc:   PainSc: 0-No pain                 Effie Berkshire

## 2021-08-27 NOTE — H&P (Signed)
08/27/2021      REFERRING PHYSICIAN: Olen Pel, *  Patient Care Team: Olen Pel, MD as PCP - General (Family Medicine)  PROVIDER: Hollace Kinnier, MD  [facility] MRN: [id number] DOB: 09-30-1940   Subjective   Chief Complaint: No chief complaint on file.   History of Present Illness: Terry Parker is a 81 y.o. male who is seen today as an office consultation at the request of Dr. Moreen Fowler for evaluation of hernia.   Pleasant elderly but very active male. Runs a retail store. Has noted some swelling in his left groin for many months. Feels like it is getting larger. Discussed with his primary care physician. Surgical consultation offered. He comes today by himself. He normally is walking all day and can do at least 1/2-hour without much difficulty. He takes half an aspirin a day. No other blood thinners. No cardiac or pulmonary issues. He does not smoke. No diabetes. No sleep apnea. Moves his bowels every day. He does probably get up to urinate once or twice a night but denies any hesitancy or difficulty starting stream. Does not have any prostate issues that he knows of. Does not have a urologist. He does seem to be hard of hearing & has difficulty when he can read lips. I tried to talk more slowly and loudly for him.   Medical History: History reviewed. No pertinent past medical history.  There is no problem list on file for this patient.  History reviewed. No pertinent surgical history.   No Known Allergies  Current Outpatient Medications on File Prior to Visit  Medication Sig Dispense Refill   lisinopriL-hydrochlorothiazide (ZESTORETIC) 10-12.5 mg tablet Take 1 tablet by mouth once daily   rosuvastatin (CRESTOR) 10 MG tablet Take 10 mg by mouth once daily   No current facility-administered medications on file prior to visit.   Family History  Family history unknown: Yes    Social History   Tobacco Use  Smoking Status Never Smoker  Smokeless Tobacco  Never Used    Social History   Socioeconomic History   Marital status: Married  Tobacco Use   Smoking status: Never Smoker   Smokeless tobacco: Never Used  Substance and Sexual Activity   Alcohol use: Yes  Alcohol/week: 7.0 standard drinks  Types: 7 Cans of beer per week   Drug use: Never   ############################################################  Review of Systems: A complete review of systems (ROS) was obtained from the patient. I have reviewed this information and discussed as appropriate with the patient. See HPI as well for other pertinent ROS.  Constitutional: No fevers, chills, sweats. Weight stable Eyes: No vision changes, No discharge HENT: No sore throats, nasal drainage Lymph: No neck swelling, No bruising easily Pulmonary: No cough, productive sputum CV: No orthopnea, PND Patient walks 60 minutes for about 2 miles without difficulty. No exertional chest/neck/shoulder/arm pain.  GI: No personal nor family history of GI/colon cancer, inflammatory bowel disease, irritable bowel syndrome, allergy such as Celiac Sprue, dietary/dairy problems, colitis, ulcers nor gastritis. No recent sick contacts/gastroenteritis. No travel outside the country. No changes in diet.  Renal: No UTIs, No hematuria Genital: No drainage, bleeding, masses. Nocturia 1 time a night at most. No hesitancy or difficulty starting stream. No prostate issues that he knows of Musculoskeletal: No severe joint pain. Good ROM major joints Skin: No sores or lesions Heme/Lymph: No easy bleeding. No swollen lymph nodes  Objective:   Vitals:  06/17/21 1605  BP: 126/62  Pulse: 77  Temp: 36.7 C (98 F)  SpO2: 98%  Weight: 74.8 kg (164 lb 12.8 oz)  Height: 174 cm (5' 8.5")    Body mass index is 24.69 kg/m.  PHYSICAL EXAM:  Constitutional: Not cachectic. Hygeine adequate. Vitals signs as above.  Eyes: Pupils reactive, normal extraocular movements. Sclera nonicteric Neuro: CN II-XII intact. No  major focal sensory defects. No major motor deficits. Lymph: No head/neck/groin lymphadenopathy Psych: No severe agitation. No severe anxiety. Judgment & insight Adequate, Oriented x4, HENT: Normocephalic, Mucus membranes moist. No thrush. Hard of hearing. Neck: Supple, No tracheal deviation. No obvious thyromegaly Chest: No pain to chest wall compression. Good respiratory excursion. No audible wheezing CV: Pulses intact. Regular rhythm. No major extremity edema  Abdomen: Flat Hernia: Not present. Diastasis recti: Mild supraumbilical midline. Soft. Nondistended. Nontender. No hepatomegaly. No splenomegaly  Gen: Inguinal hernia: Obvious left inguinal hernia mildly sensitive but reducible. Small impulse in right lateral groin suspicious for indirect right inguinal hernia as well. Hip today and testicles mildly atrophic but intact and symmetrical without masses. Circumcised.. Inguinal lymph nodes: without lymphadenopathy.   Rectal: (Deferred)  Ext: No obvious deformity or contracture. Edema: Not present. No cyanosis Skin: No major subcutaneous nodules. Warm and dry Musculoskeletal: Severe joint rigidity not present. No obvious clubbing. No digital petechiae.   Labs, Imaging and Diagnostic Testing:  Located in [place]' section of Epic EMR chart  PRIOR NOTES   Not applicable  SURGERY NOTES:  Not applicable  PATHOLOGY:  Not applicable  Assessment and Plan:  DIAGNOSES:  Diagnoses and all orders for this visit:  Left inguinal hernia  Other specified forms of hearing loss, unspecified laterality  Diastasis recti    ASSESSMENT/PLAN  Elderly but very active male with obvious left inguinal hernia and possible small right inguinal hernia.  I think he would benefit from surgery to repair this. Outpatient laparoscopic repair. Should be outpatient surgery. Despite his advanced age, he good exercise tolerance and is very active.  I did caution he will need several weeks  before thinking about doing driving or moderate activity, little on returning to work. Bruising swelling as expected. Certainly reasonable to walk and be active. It hurts to do it, do not do it. If there is no pain, can try and be more active. However, he can go back to work right away. He seemed to want to negotiate about sneaking back sooner if his wife can drive him. Sounds like he is self-employed. I cautioned against that but noted all I can do is advise. 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 He has not had any episodes of severe pain or need for emergency reduction, so probably okay to wait a couple of months when it is less inconvenient. He also wanted to try and get it done on a Tuesday/Wednesday/Thursday. He notes he has bad luck trying to get things done on a Monday or Friday.   The anatomy & physiology of the abdominal wall and pelvic floor was discussed. The pathophysiology of hernias in the inguinal and pelvic region was discussed. Natural history risks such as progressive enlargement, pain, incarceration, and strangulation was discussed. Contributors to complications such as smoking, obesity, diabetes, prior surgery, etc were discussed.   I feel the risks of no intervention will lead to serious problems that outweigh the operative risks; therefore, I recommended surgery to reduce and repair the hernia. I explained laparoscopic  techniques with possible need for an open approach. I noted usual use of mesh to patch and/or buttress hernia repair  Risks such as bleeding, infection, abscess, need for further treatment, injury to other organs, need for repair of tissues / organs, stroke, heart attack, death, and other risks were discussed. I noted a good likelihood this will help address the problem. Goals of post-operative recovery were discussed as well. Possibility that this will not correct all symptoms was  explained. I stressed the importance of low-impact activity, aggressive pain control, avoiding constipation, & not pushing through pain to minimize risk of post-operative chronic pain or injury. Possibility of reherniation was discussed. We will work to minimize complications.   An educational handout further explaining the pathology & treatment options was given as well. Questions were answered. The patient expresses understanding & wishes to proceed with surgery.    [name], MD, FACS, MASCRS Esophageal, Gastrointestinal & Colorectal Surgery Robotic and Minimally Invasive Surgery  [facility], [place]. [address], [place], [place] [address] [phone] Fax [phone] Main  CONTACT INFORMATION:  Weekday (9AM-5PM): Call [facility] main office at [phone]  Weeknight (5PM-9AM) or Weekend/Holiday: Check [email or web address] (password " [email or web address]") for General Surgery [facility] coverage  (Please, do not use SecureChat as it is not reliable communication to operating surgeons for immediate patient care)    08/27/2021

## 2021-08-27 NOTE — Anesthesia Preprocedure Evaluation (Addendum)
Anesthesia Evaluation  Patient identified by MRN, date of birth, ID band Patient awake    Reviewed: Allergy & Precautions, NPO status , Patient's Chart, lab work & pertinent test results  Airway Mallampati: II  TM Distance: >3 FB Neck ROM: Full    Dental  (+) Teeth Intact, Dental Advisory Given   Pulmonary neg pulmonary ROS,    breath sounds clear to auscultation       Cardiovascular hypertension, Pt. on medications  Rhythm:Regular Rate:Normal     Neuro/Psych negative neurological ROS  negative psych ROS   GI/Hepatic negative GI ROS, Neg liver ROS,   Endo/Other  negative endocrine ROS  Renal/GU CRFRenal disease     Musculoskeletal negative musculoskeletal ROS (+)   Abdominal Normal abdominal exam  (+)   Peds  Hematology negative hematology ROS (+)   Anesthesia Other Findings   Reproductive/Obstetrics                            Anesthesia Physical Anesthesia Plan  ASA: 3  Anesthesia Plan: General   Post-op Pain Management:    Induction: Intravenous  PONV Risk Score and Plan: 3 and Ondansetron, Dexamethasone and Treatment may vary due to age or medical condition  Airway Management Planned: Oral ETT  Additional Equipment: None  Intra-op Plan:   Post-operative Plan: Extubation in OR  Informed Consent: I have reviewed the patients History and Physical, chart, labs and discussed the procedure including the risks, benefits and alternatives for the proposed anesthesia with the patient or authorized representative who has indicated his/her understanding and acceptance.     Dental advisory given  Plan Discussed with: CRNA  Anesthesia Plan Comments:        Anesthesia Quick Evaluation

## 2021-08-27 NOTE — Transfer of Care (Signed)
Immediate Anesthesia Transfer of Care Note  Patient: Terry Parker.  Procedure(s) Performed: LAPAROSCOPIC BILATERAL INGUINAL HERNIA REPAIRS WITH MESH TAP BLOCK (Bilateral: Abdomen)  Patient Location: PACU  Anesthesia Type:General  Level of Consciousness: awake  Airway & Oxygen Therapy: Patient Spontanous Breathing and Patient connected to face mask oxygen  Post-op Assessment: Report given to RN and Post -op Vital signs reviewed and stable  Post vital signs: Reviewed and stable  Last Vitals:  Vitals Value Taken Time  BP 169/76 08/27/21 1104  Temp    Pulse 74 08/27/21 1107  Resp 14 08/27/21 1107  SpO2 100 % 08/27/21 1107  Vitals shown include unvalidated device data.  Last Pain:  Vitals:   08/27/21 0901  TempSrc: Oral         Complications: No notable events documented.

## 2021-08-27 NOTE — Discharge Instructions (Signed)
HERNIA REPAIR: POST OP INSTRUCTIONS  ######################################################################  EAT Gradually transition to a high fiber diet with a fiber supplement over the next few weeks after discharge.  Start with a pureed / full liquid diet (see below)  WALK Walk an hour a day.  Control your pain to do that.    CONTROL PAIN Control pain so that you can walk, sleep, tolerate sneezing/coughing, and go up/down stairs.  HAVE A BOWEL MOVEMENT DAILY Keep your bowels regular to avoid problems.  OK to try a laxative to override constipation.  OK to use an antidairrheal to slow down diarrhea.  Call if not better after 2 tries  CALL IF YOU HAVE PROBLEMS/CONCERNS Call if you are still struggling despite following these instructions. Call if you have concerns not answered by these instructions  ######################################################################    DIET: Follow a light bland diet & liquids the first 24 hours after arrival home, such as soup, liquids, starches, etc.  Be sure to drink plenty of fluids.  Quickly advance to a usual solid diet within a few days.  Avoid fast food or heavy meals as your are more likely to get nauseated or have irregular bowels.  A low-fat, high-fiber diet for the rest of your life is ideal.   Take your usually prescribed home medications unless otherwise directed.  PAIN CONTROL: Pain is best controlled by a usual combination of three different methods TOGETHER: Ice/Heat Over the counter pain medication Prescription pain medication Most patients will experience some swelling and bruising around the hernia(s) such as the bellybutton, groins, or old incisions.  Ice packs or heating pads (30-60 minutes up to 6 times a day) will help. Use ice for the first few days to help decrease swelling and bruising, then switch to heat to help relax tight/sore spots and speed recovery.  Some people prefer to use ice alone, heat alone, alternating  between ice & heat.  Experiment to what works for you.  Swelling and bruising can take several weeks to resolve.   It is helpful to take an over-the-counter pain medication regularly for the first few weeks.  Choose one of the following that works best for you: Naproxen (Aleve, etc)  Two 220mg tabs twice a day Ibuprofen (Advil, etc) Three 200mg tabs four times a day (every meal & bedtime) Acetaminophen (Tylenol, etc) 325-650mg four times a day (every meal & bedtime) A  prescription for pain medication should be given to you upon discharge.  Take your pain medication as prescribed.  If you are having problems/concerns with the prescription medicine (does not control pain, nausea, vomiting, rash, itching, etc), please call us [phone] to see if we need to switch you to a different pain medicine that will work better for you and/or control your side effect better. If you need a refill on your pain medication, please contact your pharmacy.  They will contact our office to request authorization. Prescriptions will not be filled after 5 pm or on week-ends.  Avoid getting constipated.  Between the surgery and the pain medications, it is common to experience some constipation.  Increasing fluid intake and taking a fiber supplement (such as Metamucil, Citrucel, FiberCon, MiraLax, etc) 1-2 times a day regularly will usually help prevent this problem from occurring.  A mild laxative (prune juice, Milk of Magnesia, MiraLax, etc) should be taken according to package directions if there are no bowel movements after 48 hours.    Wash / shower every day.  You may shower over the dressings   as they are waterproof.    Remove your waterproof bandages, skin tapes, and other bandages 3 days after surgery. You may replace a dressing/Band-Aid to cover the incision for comfort if you wish. You may leave the incisions open to air.  You may replace a dressing/Band-Aid to cover an incision for comfort if you wish.  Continue  to shower over incision(s) after the dressing is off.  ACTIVITIES as tolerated:   You may resume regular (light) daily activities beginning the next day--such as daily self-care, walking, climbing stairs--gradually increasing activities as tolerated.  Control your pain so that you can walk an hour a day.  If you can walk 30 minutes without difficulty, it is safe to try more intense activity such as jogging, treadmill, bicycling, low-impact aerobics, swimming, etc. Save the most intensive and strenuous activity for last such as sit-ups, heavy lifting, contact sports, etc  Refrain from any heavy lifting or straining until you are off narcotics for pain control.   DO NOT PUSH THROUGH PAIN.  Let pain be your guide: If it hurts to do something, don't do it.  Pain is your body warning you to avoid that activity for another week until the pain goes down. You may drive when you are no longer taking prescription pain medication, you can comfortably wear a seatbelt, and you can safely maneuver your car and apply brakes. You may have sexual intercourse when it is comfortable.   FOLLOW UP in our office Please call [facility] at [phone] to set up an appointment to see your surgeon in the office for a follow-up appointment approximately 2-3 weeks after your surgery. Make sure that you call for this appointment the day you arrive home to insure a convenient appointment time.  9.  If you have disability of FMLA / Family leave forms, please bring the forms to the office for processing.  (do not give to your surgeon).  WHEN TO CALL US [phone]: Poor pain control Reactions / problems with new medications (rash/itching, nausea, etc)  Fever over 101.5 F (38.5 C) Inability to urinate Nausea and/or vomiting Worsening swelling or bruising Continued bleeding from incision. Increased pain, redness, or drainage from the incision   The clinic staff is available to answer your questions during regular business  hours (8:30am-5pm).  Please don't hesitate to call and ask to speak to one of our nurses for clinical concerns.   If you have a medical emergency, go to the nearest emergency room or call 911.  A surgeon from [facility] is always on call at the hospitals in [place]  [facility] [address], [address], [place], [place]  [address] ?  [address] [address], [place], [place]   [address] MAIN: [phone] ? TOLL FREE: [phone] ? FAX: [phone] [email or web address]  

## 2021-08-27 NOTE — Anesthesia Procedure Notes (Signed)
Procedure Name: Intubation Date/Time: 08/27/2021 9:31 AM Performed by: Lieutenant Diego, CRNA Pre-anesthesia Checklist: Patient identified, Emergency Drugs available, Suction available and Patient being monitored Patient Re-evaluated:Patient Re-evaluated prior to induction Oxygen Delivery Method: Circle system utilized Preoxygenation: Pre-oxygenation with 100% oxygen Induction Type: IV induction Ventilation: Mask ventilation without difficulty Laryngoscope Size: Miller and 2 Grade View: Grade I Tube type: Oral Tube size: 7.5 mm Number of attempts: 1 Airway Equipment and Method: Stylet and Oral airway Placement Confirmation: ETT inserted through vocal cords under direct vision, positive ETCO2 and breath sounds checked- equal and bilateral Secured at: 22 cm Tube secured with: Tape Dental Injury: Teeth and Oropharynx as per pre-operative assessment

## 2021-08-27 NOTE — Interval H&P Note (Signed)
History and Physical Interval Note:  08/27/2021 9:11 AM  Terry Parker.  has presented today for surgery, with the diagnosis of LEFT AND POSSIBLE RIGHT INGUINAL HERNIAS.  The various methods of treatment have been discussed with the patient and family. After consideration of risks, benefits and other options for treatment, the patient has consented to  Procedure(s): LAPAROSCOPIC LEFT AND POSSIBLE RIGHT INGUINAL HERNIA REPAIRS WITH MESH (Left) as a surgical intervention.  The patient's history has been reviewed, patient examined, no change in status, stable for surgery.  I have reviewed the patient's chart and labs.  Questions were answered to the patient's satisfaction.     I have re-reviewed the the patient's records, history, medications, and allergies.  I have re-examined the patient.  I again discussed intraoperative plans and goals of post-operative recovery.  The patient agrees to proceed.  Terry WATTENBARGER Jr.  Aug 02, 1941 [id number]  Patient Care Team: Antony Contras, MD as PCP - General (Family Medicine) Michael Boston, MD as Consulting Physician (General Surgery)  There are no problems to display for this patient.   Past Medical History:  Diagnosis Date   Chronic kidney disease    Hypertension     Past Surgical History:  Procedure Laterality Date   EYE SURGERY Bilateral 2003   TONSILLECTOMY     as a child    Social History   Socioeconomic History   Marital status: Married    Spouse name: Not on file   Number of children: Not on file   Years of education: Not on file   Highest education level: Not on file  Occupational History   Not on file  Tobacco Use   Smoking status: Never   Smokeless tobacco: Never  Vaping Use   Vaping Use: Never used  Substance and Sexual Activity   Alcohol use: Yes    Alcohol/week: 7.0 standard drinks    Types: 7 Cans of beer per week   Drug use: Never   Sexual activity: Not on file  Other Topics Concern   Not on file  Social History  Narrative   Not on file   Social Determinants of Health   Financial Resource Strain: Not on file  Food Insecurity: Not on file  Transportation Needs: Not on file  Physical Activity: Not on file  Stress: Not on file  Social Connections: Not on file  Intimate Partner Violence: Not on file    History reviewed. No pertinent family history.  Medications Prior to Admission  Medication Sig Dispense Refill Last Dose   aspirin 325 MG EC tablet Take 162.5 mg by mouth daily.   Past Week   Coenzyme Q10 (COQ10 PO) Take 300 mg by mouth daily.   08/26/2021   lisinopril-hydrochlorothiazide (ZESTORETIC) 10-12.5 MG tablet Take 1 tablet by mouth daily.   08/27/2021 at 0500   Multiple Vitamins-Minerals (MULTIVITAMIN WITH MINERALS) tablet Take 1 tablet by mouth daily.   08/26/2021   Omega-3 Fatty Acids (FISH OIL) 1000 MG CAPS Take 3,000 mg by mouth daily.   08/26/2021   rosuvastatin (CRESTOR) 10 MG tablet Take 10 mg by mouth daily.   08/27/2021 at 0500   vitamin C (ASCORBIC ACID) 250 MG tablet Take 250 mg by mouth daily.   08/26/2021    Current Facility-Administered Medications  Medication Dose Route Frequency Provider Last Rate Last Admin   0.9 %  sodium chloride infusion  250 mL Intravenous PRN Michael Boston, MD       bupivacaine liposome (EXPAREL) 1.3 %  injection 266 mg  20 mL Infiltration Once Michael Boston, MD       ceFAZolin (ANCEF) IVPB 2g/100 mL premix  2 g Intravenous On Call to OR Michael Boston, MD       Chlorhexidine Gluconate Cloth 2 % PADS 6 each  6 each Topical Once Michael Boston, MD       lactated ringers infusion   Intravenous Continuous Pervis Hocking, DO 10 mL/hr at 08/27/21 0900 New Bag at 08/27/21 0900   sodium chloride flush (NS) 0.9 % injection 3 mL  3 mL Intravenous Gorden Harms, MD       sodium chloride flush (NS) 0.9 % injection 3 mL  3 mL Intravenous PRN Michael Boston, MD         No Known Allergies  BP (!) 179/84    Pulse 70    Temp 98.2 F (36.8 C) (Oral)    Resp  16    SpO2 100%   Labs: No results found for this or any previous visit (from the past 48 hour(s)).  Imaging / Studies: No results found.   [name], M.D., F.A.C.S. Gastrointestinal and Minimally Invasive Surgery [facility] [address], [place], [place] [id number] [phone] Main / Paging  08/27/2021 9:11 AM    [name]

## 2021-08-31 ENCOUNTER — Encounter (HOSPITAL_COMMUNITY): Payer: Self-pay | Admitting: Surgery

## 2021-11-17 DIAGNOSIS — L570 Actinic keratosis: Secondary | ICD-10-CM | POA: Diagnosis not present

## 2021-11-17 DIAGNOSIS — D225 Melanocytic nevi of trunk: Secondary | ICD-10-CM | POA: Diagnosis not present

## 2021-11-17 DIAGNOSIS — D1801 Hemangioma of skin and subcutaneous tissue: Secondary | ICD-10-CM | POA: Diagnosis not present

## 2021-11-17 DIAGNOSIS — Z85828 Personal history of other malignant neoplasm of skin: Secondary | ICD-10-CM | POA: Diagnosis not present

## 2021-11-17 DIAGNOSIS — L821 Other seborrheic keratosis: Secondary | ICD-10-CM | POA: Diagnosis not present

## 2021-12-01 DIAGNOSIS — N529 Male erectile dysfunction, unspecified: Secondary | ICD-10-CM | POA: Diagnosis not present

## 2021-12-01 DIAGNOSIS — E782 Mixed hyperlipidemia: Secondary | ICD-10-CM | POA: Diagnosis not present

## 2021-12-01 DIAGNOSIS — D692 Other nonthrombocytopenic purpura: Secondary | ICD-10-CM | POA: Diagnosis not present

## 2021-12-01 DIAGNOSIS — J309 Allergic rhinitis, unspecified: Secondary | ICD-10-CM | POA: Diagnosis not present

## 2021-12-01 DIAGNOSIS — N1831 Chronic kidney disease, stage 3a: Secondary | ICD-10-CM | POA: Diagnosis not present

## 2021-12-01 DIAGNOSIS — I10 Essential (primary) hypertension: Secondary | ICD-10-CM | POA: Diagnosis not present

## 2021-12-01 DIAGNOSIS — M48061 Spinal stenosis, lumbar region without neurogenic claudication: Secondary | ICD-10-CM | POA: Diagnosis not present

## 2022-05-10 DIAGNOSIS — H93293 Other abnormal auditory perceptions, bilateral: Secondary | ICD-10-CM | POA: Diagnosis not present

## 2022-05-10 DIAGNOSIS — H6123 Impacted cerumen, bilateral: Secondary | ICD-10-CM | POA: Diagnosis not present

## 2022-06-25 DIAGNOSIS — H903 Sensorineural hearing loss, bilateral: Secondary | ICD-10-CM | POA: Diagnosis not present

## 2022-07-21 DIAGNOSIS — E782 Mixed hyperlipidemia: Secondary | ICD-10-CM | POA: Diagnosis not present

## 2022-07-23 DIAGNOSIS — E782 Mixed hyperlipidemia: Secondary | ICD-10-CM | POA: Diagnosis not present

## 2022-07-23 DIAGNOSIS — N1831 Chronic kidney disease, stage 3a: Secondary | ICD-10-CM | POA: Diagnosis not present

## 2022-07-23 DIAGNOSIS — J309 Allergic rhinitis, unspecified: Secondary | ICD-10-CM | POA: Diagnosis not present

## 2022-07-23 DIAGNOSIS — N529 Male erectile dysfunction, unspecified: Secondary | ICD-10-CM | POA: Diagnosis not present

## 2022-07-23 DIAGNOSIS — I10 Essential (primary) hypertension: Secondary | ICD-10-CM | POA: Diagnosis not present

## 2022-07-23 DIAGNOSIS — Z0000 Encounter for general adult medical examination without abnormal findings: Secondary | ICD-10-CM | POA: Diagnosis not present

## 2022-07-23 DIAGNOSIS — M48061 Spinal stenosis, lumbar region without neurogenic claudication: Secondary | ICD-10-CM | POA: Diagnosis not present

## 2022-07-23 DIAGNOSIS — Z8669 Personal history of other diseases of the nervous system and sense organs: Secondary | ICD-10-CM | POA: Diagnosis not present

## 2022-07-23 DIAGNOSIS — D692 Other nonthrombocytopenic purpura: Secondary | ICD-10-CM | POA: Diagnosis not present

## 2022-07-26 DIAGNOSIS — D2261 Melanocytic nevi of right upper limb, including shoulder: Secondary | ICD-10-CM | POA: Diagnosis not present

## 2022-07-26 DIAGNOSIS — L821 Other seborrheic keratosis: Secondary | ICD-10-CM | POA: Diagnosis not present

## 2022-07-26 DIAGNOSIS — L570 Actinic keratosis: Secondary | ICD-10-CM | POA: Diagnosis not present

## 2022-07-26 DIAGNOSIS — D2262 Melanocytic nevi of left upper limb, including shoulder: Secondary | ICD-10-CM | POA: Diagnosis not present

## 2022-07-26 DIAGNOSIS — Z85828 Personal history of other malignant neoplasm of skin: Secondary | ICD-10-CM | POA: Diagnosis not present

## 2022-11-30 DIAGNOSIS — M48061 Spinal stenosis, lumbar region without neurogenic claudication: Secondary | ICD-10-CM | POA: Diagnosis not present

## 2022-11-30 DIAGNOSIS — N529 Male erectile dysfunction, unspecified: Secondary | ICD-10-CM | POA: Diagnosis not present

## 2022-11-30 DIAGNOSIS — E782 Mixed hyperlipidemia: Secondary | ICD-10-CM | POA: Diagnosis not present

## 2022-11-30 DIAGNOSIS — D692 Other nonthrombocytopenic purpura: Secondary | ICD-10-CM | POA: Diagnosis not present

## 2022-11-30 DIAGNOSIS — N1831 Chronic kidney disease, stage 3a: Secondary | ICD-10-CM | POA: Diagnosis not present

## 2022-11-30 DIAGNOSIS — J309 Allergic rhinitis, unspecified: Secondary | ICD-10-CM | POA: Diagnosis not present

## 2022-11-30 DIAGNOSIS — I10 Essential (primary) hypertension: Secondary | ICD-10-CM | POA: Diagnosis not present

## 2023-01-26 DIAGNOSIS — L570 Actinic keratosis: Secondary | ICD-10-CM | POA: Diagnosis not present

## 2023-01-26 DIAGNOSIS — L821 Other seborrheic keratosis: Secondary | ICD-10-CM | POA: Diagnosis not present

## 2023-01-26 DIAGNOSIS — D485 Neoplasm of uncertain behavior of skin: Secondary | ICD-10-CM | POA: Diagnosis not present

## 2023-01-26 DIAGNOSIS — Z85828 Personal history of other malignant neoplasm of skin: Secondary | ICD-10-CM | POA: Diagnosis not present

## 2023-01-26 DIAGNOSIS — D2371 Other benign neoplasm of skin of right lower limb, including hip: Secondary | ICD-10-CM | POA: Diagnosis not present

## 2023-01-26 DIAGNOSIS — D225 Melanocytic nevi of trunk: Secondary | ICD-10-CM | POA: Diagnosis not present

## 2023-03-14 DIAGNOSIS — L03115 Cellulitis of right lower limb: Secondary | ICD-10-CM | POA: Diagnosis not present

## 2023-03-14 DIAGNOSIS — L02415 Cutaneous abscess of right lower limb: Secondary | ICD-10-CM | POA: Diagnosis not present

## 2023-08-02 DIAGNOSIS — D692 Other nonthrombocytopenic purpura: Secondary | ICD-10-CM | POA: Diagnosis not present

## 2023-08-02 DIAGNOSIS — L570 Actinic keratosis: Secondary | ICD-10-CM | POA: Diagnosis not present

## 2023-08-02 DIAGNOSIS — L820 Inflamed seborrheic keratosis: Secondary | ICD-10-CM | POA: Diagnosis not present

## 2023-08-02 DIAGNOSIS — D225 Melanocytic nevi of trunk: Secondary | ICD-10-CM | POA: Diagnosis not present

## 2023-08-02 DIAGNOSIS — Z85828 Personal history of other malignant neoplasm of skin: Secondary | ICD-10-CM | POA: Diagnosis not present

## 2023-08-02 DIAGNOSIS — L821 Other seborrheic keratosis: Secondary | ICD-10-CM | POA: Diagnosis not present

## 2023-08-30 DIAGNOSIS — Z1331 Encounter for screening for depression: Secondary | ICD-10-CM | POA: Diagnosis not present

## 2023-08-30 DIAGNOSIS — N1831 Chronic kidney disease, stage 3a: Secondary | ICD-10-CM | POA: Diagnosis not present

## 2023-08-30 DIAGNOSIS — J309 Allergic rhinitis, unspecified: Secondary | ICD-10-CM | POA: Diagnosis not present

## 2023-08-30 DIAGNOSIS — N529 Male erectile dysfunction, unspecified: Secondary | ICD-10-CM | POA: Diagnosis not present

## 2023-08-30 DIAGNOSIS — E782 Mixed hyperlipidemia: Secondary | ICD-10-CM | POA: Diagnosis not present

## 2023-08-30 DIAGNOSIS — Z0000 Encounter for general adult medical examination without abnormal findings: Secondary | ICD-10-CM | POA: Diagnosis not present

## 2023-08-30 DIAGNOSIS — M48061 Spinal stenosis, lumbar region without neurogenic claudication: Secondary | ICD-10-CM | POA: Diagnosis not present

## 2023-08-30 DIAGNOSIS — R600 Localized edema: Secondary | ICD-10-CM | POA: Diagnosis not present

## 2023-08-30 DIAGNOSIS — I10 Essential (primary) hypertension: Secondary | ICD-10-CM | POA: Diagnosis not present

## 2023-11-29 DIAGNOSIS — M48061 Spinal stenosis, lumbar region without neurogenic claudication: Secondary | ICD-10-CM | POA: Diagnosis not present

## 2023-11-29 DIAGNOSIS — N1831 Chronic kidney disease, stage 3a: Secondary | ICD-10-CM | POA: Diagnosis not present

## 2023-11-29 DIAGNOSIS — N529 Male erectile dysfunction, unspecified: Secondary | ICD-10-CM | POA: Diagnosis not present

## 2023-11-29 DIAGNOSIS — J309 Allergic rhinitis, unspecified: Secondary | ICD-10-CM | POA: Diagnosis not present

## 2023-11-29 DIAGNOSIS — I10 Essential (primary) hypertension: Secondary | ICD-10-CM | POA: Diagnosis not present

## 2023-11-29 DIAGNOSIS — Z23 Encounter for immunization: Secondary | ICD-10-CM | POA: Diagnosis not present

## 2023-11-29 DIAGNOSIS — E782 Mixed hyperlipidemia: Secondary | ICD-10-CM | POA: Diagnosis not present

## 2024-05-02 DIAGNOSIS — L570 Actinic keratosis: Secondary | ICD-10-CM | POA: Diagnosis not present

## 2024-05-02 DIAGNOSIS — L821 Other seborrheic keratosis: Secondary | ICD-10-CM | POA: Diagnosis not present

## 2024-05-02 DIAGNOSIS — D485 Neoplasm of uncertain behavior of skin: Secondary | ICD-10-CM | POA: Diagnosis not present

## 2024-05-02 DIAGNOSIS — Z85828 Personal history of other malignant neoplasm of skin: Secondary | ICD-10-CM | POA: Diagnosis not present

## 2024-05-02 DIAGNOSIS — D044 Carcinoma in situ of skin of scalp and neck: Secondary | ICD-10-CM | POA: Diagnosis not present

## 2024-06-05 DIAGNOSIS — N1831 Chronic kidney disease, stage 3a: Secondary | ICD-10-CM | POA: Diagnosis not present

## 2024-06-05 DIAGNOSIS — M25511 Pain in right shoulder: Secondary | ICD-10-CM | POA: Diagnosis not present

## 2024-06-05 DIAGNOSIS — E782 Mixed hyperlipidemia: Secondary | ICD-10-CM | POA: Diagnosis not present

## 2024-06-05 DIAGNOSIS — J309 Allergic rhinitis, unspecified: Secondary | ICD-10-CM | POA: Diagnosis not present

## 2024-06-05 DIAGNOSIS — N529 Male erectile dysfunction, unspecified: Secondary | ICD-10-CM | POA: Diagnosis not present

## 2024-06-05 DIAGNOSIS — I10 Essential (primary) hypertension: Secondary | ICD-10-CM | POA: Diagnosis not present

## 2024-06-05 DIAGNOSIS — M48061 Spinal stenosis, lumbar region without neurogenic claudication: Secondary | ICD-10-CM | POA: Diagnosis not present

## 2024-06-11 DIAGNOSIS — M25511 Pain in right shoulder: Secondary | ICD-10-CM | POA: Diagnosis not present

## 2024-06-19 DIAGNOSIS — M25511 Pain in right shoulder: Secondary | ICD-10-CM | POA: Diagnosis not present

## 2024-06-21 DIAGNOSIS — M25511 Pain in right shoulder: Secondary | ICD-10-CM | POA: Diagnosis not present

## 2024-06-25 DIAGNOSIS — S46011D Strain of muscle(s) and tendon(s) of the rotator cuff of right shoulder, subsequent encounter: Secondary | ICD-10-CM | POA: Diagnosis not present
